# Patient Record
Sex: Male | Born: 2000 | Race: Black or African American | Hispanic: No | Marital: Single | Smoking: Never smoker
Health system: Southern US, Community
[De-identification: ages and names within clinical notes are randomized; demographics above are authoritative.]

## PROBLEM LIST (undated history)

## (undated) HISTORY — PX: APPENDECTOMY: SHX54

---
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
## 2021-01-17 ENCOUNTER — Telehealth (INDEPENDENT_AMBULATORY_CARE_PROVIDER_SITE_OTHER): Payer: Medicaid Other | Admitting: Physician Assistant

## 2021-01-17 ENCOUNTER — Other Ambulatory Visit: Payer: Self-pay

## 2021-01-17 ENCOUNTER — Encounter (HOSPITAL_COMMUNITY): Payer: Self-pay | Admitting: Physician Assistant

## 2021-01-17 DIAGNOSIS — F6381 Intermittent explosive disorder: Secondary | ICD-10-CM

## 2021-01-17 MED ORDER — CARBAMAZEPINE 100 MG PO CHEW: 100.0000 mg | CHEWABLE_TABLET | Freq: Two times a day (BID) | ORAL | 2 refills | Status: DC

## 2021-01-17 NOTE — Progress Notes (Signed)
BH MD/PA/NP OP Progress Note  Virtual Visit via Telephone Note  I connected with Austin Cooley on 01/17/21 at  3:00 PM EST by telephone and verified that I am speaking with the correct person using two identifiers.  Location: Patient: Home Provider: Clinic   I discussed the limitations, risks, security and privacy concerns of performing an evaluation and management service by telephone and the availability of in person appointments. I also discussed with the patient that there may be a patient responsible charge related to this service. The patient expressed understanding and agreed to proceed.  Follow Up Instructions:  I discussed the assessment and treatment plan with the patient. The patient was provided an opportunity to ask questions and all were answered. The patient agreed with the plan and demonstrated an understanding of the instructions.   The patient was advised to call back or seek an in-person evaluation if the symptoms worsen or if the condition fails to improve as anticipated.  I provided 15 minutes of non-face-to-face time during this encounter.  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  Patient reports no issues or concerns with his current regimen of medication.  Patient denies the need for dosage adjustments at this time and is requesting a refill for his medication upon conclusion of the encounter.  Patient denies any new life-changing events or new stressors.  Patient is pleasant,  calm, cooperative, and fully engaged in conversation during the encounter.  Patient reports that he is doing well and spends a lot of his time working.  Patient denies suicidal and homicidal ideations.  He further denies auditory or visual hallucinations.  Patient endorses good sleep and receives on average 6 to 8 hours per sleep each night.  Patient endorses good appetite and states that he eats on average 1-2 meals per day.  Patient denies alcohol consumption, tobacco use, and illicit drug use.   Visit Diagnosis:    ICD-10-CM   1. Intermittent explosive disorder in adult  F63.81 carbamazepine (TEGRETOL) 100 MG chewable tablet    Past Psychiatric History:  Intermittent explosive disorder in adult  Past Medical History: History reviewed. No pertinent past medical history.  Past Surgical History:  Procedure Laterality Date  . APPENDECTOMY      Family Psychiatric History: Unknown  Family History: History reviewed. No pertinent family history.  Social History:  Social History   Socioeconomic History  . Marital status: Single    Spouse name: Not on file  . Number of children: Not on file  . Years of education: Not on file  . Highest education level: Not on file  Occupational History  . Not on file  Tobacco Use  . Smoking status: Never Smoker  . Smokeless tobacco: Never Used  Substance and Sexual Activity  . Alcohol use: Never  . Drug use: Never  . Sexual activity: Not on file  Other Topics Concern  . Not on file  Social History Narrative  . Not on file   Social Determinants of Health   Financial Resource Strain: Not on file  Food Insecurity: Not on file  Transportation Needs: Not on file  Physical Activity: Not on file  Stress: Not on file  Social Connections: Not on file    Allergies: No Known Allergies  Metabolic Disorder Labs: Lab Results  Component Value Date   HGBA1C 5.8 (H) 07/25/2020   MPG 119.76 07/25/2020   No results found for: PROLACTIN Lab  Results  Component Value Date   CHOL 138 07/25/2020   TRIG 25 07/25/2020   HDL 51 07/25/2020   CHOLHDL 2.7 07/25/2020   VLDL 5 07/25/2020   LDLCALC 82 07/25/2020   Lab Results  Component Value Date   TSH 0.699 07/25/2020    Therapeutic Level Labs: No results found for: LITHIUM No results found for: VALPROATE No components found for:  CBMZ  Current Medications: Current Outpatient Medications  Medication Sig Dispense Refill  . carbamazepine (TEGRETOL) 100 MG chewable tablet Chew 1 tablet (100 mg total) by mouth 2 (two) times daily. 60 tablet 2   No current facility-administered medications for this visit.     Musculoskeletal: Strength & Muscle Tone: Unable to assess due to telemedicine visit Gait & Station: Unable to assess due to telemedicine visit Patient leans: Unable to assess due to telemedicine visit  Psychiatric Specialty Exam: Review of Systems  Psychiatric/Behavioral: Negative for decreased concentration, dysphoric mood, hallucinations, self-injury, sleep disturbance and suicidal ideas. The patient is not nervous/anxious and is not hyperactive.     There were no vitals taken for this visit.There is no height or weight on file to calculate BMI.  General Appearance: Unable to assess due to telemedicine visit  Eye Contact:  Unable to assess due to telemedicine visit  Speech:  Clear and Coherent and Normal Rate  Volume:  Normal  Mood:  Euthymic  Affect:  Appropriate  Thought Process:  Coherent and Descriptions of Associations: Intact  Orientation:  Full (Time, Place, and Person)  Thought Content: WDL and Logical   Suicidal Thoughts:  No  Homicidal Thoughts:  No  Memory:  Immediate;   Good Recent;   Good Remote;   Good  Judgement:  Good  Insight:  Good  Psychomotor Activity:  Normal  Concentration:  Concentration: Good and Attention Span: Good  Recall:  Good  Fund of Knowledge: Good  Language: Good  Akathisia:  NA  Handed:  Right  AIMS (if indicated):  not done  Assets:  Communication Skills Desire for Improvement Housing Physical Health Social Support Vocational/Educational  ADL's:  Intact  Cognition: WNL  Sleep:  Good   Screenings: AIMS   Flowsheet Row [facility]  AIMS Total Score 0    AUDIT   Flowsheet Row Ad[facility] ([facility]  Alcohol Use Disorder Identification Test Final Score (AUDIT) 0    GAD-7   Flowsheet Row Video Visit from 01/17/2021 in [facility]  Total GAD-7 Score 4    PHQ2-9   Flowsheet Row Video Visit from 01/17/2021 in [place]  PHQ-2 Total Score 0    Flowsheet Row Video Visit from 01/17/2021 in [facility] Ad[facility] (Discharged[facility] ED from [facility]24/2020 in [facility] EMERGENCY [facility]PART[facility]  C-SSRS RISK CATEGORY No Risk Error: Q7 should not be populated when Q6 is No High Risk       Assessment and Plan:   Austin Cooley is an  20 year old male with a past psychiatric history significant for 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  Patient denies issues or concerns with his current medication regimen.  Patient denies the need for dosage adjustments at this time and is requesting a refill on his medication.  Patient's medication to be e-prescribed to pharmacy of choice.  1. Intermittent explosive disorder in adult  - carbamazepine (TEGRETOL) 100 MG chewable tablet; Chew 1 tablet (100 mg total) by mouth 2 (two) times daily.  Dispense: 60 tablet; Refill:  2  Patient to follow up in 2 months  Meta Hatchet, PA 01/17/2021, 2:50 PM

## 2021-03-14 ENCOUNTER — Telehealth (INDEPENDENT_AMBULATORY_CARE_PROVIDER_SITE_OTHER): Payer: Medicaid Other | Admitting: Physician Assistant

## 2021-03-14 ENCOUNTER — Other Ambulatory Visit: Payer: Self-pay

## 2021-03-14 ENCOUNTER — Encounter (HOSPITAL_COMMUNITY): Payer: Self-pay | Admitting: Physician Assistant

## 2021-03-14 DIAGNOSIS — F6381 Intermittent explosive disorder: Secondary | ICD-10-CM | POA: Diagnosis not present

## 2021-03-14 NOTE — Progress Notes (Signed)
BH MD/PA/NP OP Progress Note  Virtual Visit via Telephone Note  I connected with Austin Cooley on 03/14/21 at  2:00 PM EDT by telephone and verified that I am speaking with the correct person using two identifiers.  Location: Patient: Home Provider: Clinic   I discussed the limitations, risks, security and privacy concerns of performing an evaluation and management service by telephone and the availability of in person appointments. I also discussed with the patient that there may be a patient responsible charge related to this service. The patient expressed understanding and agreed to proceed.  Follow Up Instructions:   I discussed the assessment and treatment plan with the patient. The patient was provided an opportunity to ask questions and all were answered. The patient agreed with the plan and demonstrated an understanding of the instructions.   The patient was advised to call back or seek an in-person evaluation if the symptoms worsen or if the condition fails to improve as anticipated.  I provided 15 minutes of non-face-to-face time during this encounter.  Meta Hatchet, PA   03/14/2021 2:18 PM Austin Cooley  MRN:  [id number]  Chief Complaint: Follow up and medication management  HPI:   Austin Cooley is an 20 year old male with a past psychiatric history significant for intermittent explosive disorder who presents to [facility] via a virtual telephone visit for follow-up and medication management.  Patient is currently being managed on the following medication: Carbamazepine 100 mg 2 times daily.  Patient reports no issues or concerns regarding his current medication regimen.  Patient denies the need for dosage adjustments at this time and is not requesting refills on his medication at this time.  Patient denies any new stressors or life changing events and is still working.  Patient is pleasant, calm, cooperative, and fully engaged  in conversation during the encounter.  Patient reports that his mood has been good as of late.  Patient denies suicidal or homicidal ideations.  He further denies auditory or visual hallucinations and does not appear to be responding to internal/external stimuli.  Patient endorses good sleep and he received an average of 8 hours of sleep each night.  Patient endorses good appetite and eats on average 2 full sized meals per day.  Patient denies alcohol consumption, tobacco use, and illicit drug use.   Visit Diagnosis:    ICD-10-CM   1. Intermittent explosive disorder in adult  F63.81     Past Psychiatric History:  Intermittent explosive disorderin adult  Past Medical History: History reviewed. No pertinent past medical history.  Past Surgical History:  Procedure Laterality Date  . APPENDECTOMY      Family Psychiatric History:  Unknown  Family History: History reviewed. No pertinent family history.  Social History:  Social History   Socioeconomic History  . Marital status: Single    Spouse name: Not on file  . Number of children: Not on file  . Years of education: Not on file  . Highest education level: Not on file  Occupational History  . Not on file  Tobacco Use  . Smoking status: Never Smoker  . Smokeless tobacco: Never Used  Substance and Sexual Activity  . Alcohol use: Never  . Drug use: Never  . Sexual activity: Not on file  Other Topics Concern  . Not on file  Social History Narrative  . Not on file   Social Determinants of Health   Financial Resource Strain: Not on file  Food Insecurity: Not on  file  Transportation Needs: Not on file  Physical Activity: Not on file  Stress: Not on file  Social Connections: Not on file    Allergies: No Known Allergies  Metabolic Disorder Labs: Lab Results  Component Value Date   HGBA1C 5.8 (H) 07/25/2020   MPG 119.76 07/25/2020   No results found for: PROLACTIN Lab Results  Component Value Date   CHOL 138  07/25/2020   TRIG 25 07/25/2020   HDL 51 07/25/2020   CHOLHDL 2.7 07/25/2020   VLDL 5 07/25/2020   LDLCALC 82 07/25/2020   Lab Results  Component Value Date   TSH 0.699 07/25/2020    Therapeutic Level Labs: No results found for: LITHIUM No results found for: VALPROATE No components found for:  CBMZ  Current Medications: Current Outpatient Medications  Medication Sig Dispense Refill  . carbamazepine (TEGRETOL) 100 MG chewable tablet Chew 1 tablet (100 mg total) by mouth 2 (two) times daily. 60 tablet 2   No current facility-administered medications for this visit.     Musculoskeletal: Strength & Muscle Tone: Unable to assess due to telemedicine visit Gait & Station: Unable to assess due to telemedicine visit Patient leans: Unable to assess due to telemedicine visit  Psychiatric Specialty Exam: Review of Systems  Psychiatric/Behavioral: Negative for decreased concentration, dysphoric mood, hallucinations, self-injury, sleep disturbance and suicidal ideas. The patient is not nervous/anxious and is not hyperactive.     There were no vitals taken for this visit.There is no height or weight on file to calculate BMI.  General Appearance: Unable to assess due to telemedicine visit  Eye Contact:  Unable to assess due to telemedicine visit  Speech:  Clear and Coherent and Normal Rate  Volume:  Normal  Mood:  Euthymic  Affect:  Appropriate  Thought Process:  Coherent and Descriptions of Associations: Intact  Orientation:  Full (Time, Place, and Person)  Thought Content: WDL and Logical   Suicidal Thoughts:  No  Homicidal Thoughts:  No  Memory:  Immediate;   Good Recent;   Good Remote;   Good  Judgement:  Good  Insight:  Good  Psychomotor Activity:  Normal  Concentration:  Concentration: Good and Attention Span: Good  Recall:  Good  Fund of Knowledge: Good  Language: Good  Akathisia:  NA  Handed:  Right  AIMS (if indicated): not done  Assets:  Communication  Skills Desire for Improvement Housing Social Support Vocational/Educational  ADL's:  Intact  Cognition: WNL  Sleep:  Good   Screenings: AIMS   Flowsheet Row [facility]  AIMS Total Score 0    AUDIT   Flowsheet Row Ad[facility] ([facility]  Alcohol Use Disorder Identification Test Final Score (AUDIT) 0    GAD-7   Flowsheet Row Video Visit from 03/14/2021 in [facility] Video Visit from 01/17/2021 in [facility]  Total GAD-7 Score 0 4    PHQ2-9   Flowsheet Row Video Visit from 03/14/2021 in [facility] Video Visit from 01/17/2021 in [place]  PHQ-2 Total Score 0 0    Flowsheet Row Video Visit from 03/14/2021 in [facility] Video Visit from 01/17/2021 in [facility] Admission (Discharged[facility]  C-SSRS RISK CATEGORY No Risk No Risk Error: Q7 should not be populated when Q6 is  No       Assessment and Plan:   Austin Cooley is an 20 year old male with a past psychiatric history significant for intermittent explosive disorder who presents to [facility] via a virtual telephone visit for follow-up and medication management. Patient reports no issues or concerns regarding his current medication regimen.  Patient denies the need for dosage adjustments at this time and is not requesting refills on his medication at this time.  1. Intermittent explosive disorder in adult Patient to continue taking carbamazepine 100 mg 2 times daily  Patient to follow up in 3 months  Meta Hatchet, PA 03/14/2021, 2:18 PM

## 2021-06-13 ENCOUNTER — Telehealth (INDEPENDENT_AMBULATORY_CARE_PROVIDER_SITE_OTHER): Payer: Medicaid Other | Admitting: Physician Assistant

## 2021-06-13 ENCOUNTER — Other Ambulatory Visit: Payer: Self-pay

## 2021-06-13 ENCOUNTER — Encounter (HOSPITAL_COMMUNITY): Payer: Self-pay | Admitting: Physician Assistant

## 2021-06-13 DIAGNOSIS — F6381 Intermittent explosive disorder: Secondary | ICD-10-CM | POA: Diagnosis not present

## 2021-06-13 MED ORDER — CARBAMAZEPINE 100 MG PO CHEW: 100.0000 mg | CHEWABLE_TABLET | Freq: Two times a day (BID) | ORAL | 2 refills | Status: DC

## 2021-06-13 NOTE — Progress Notes (Signed)
BH MD/PA/NP OP Progress Note  Virtual Visit via Telephone Note  I connected with Austin Cooley on 06/13/21 at  1:30 PM EDT by telephone and verified that I am speaking with the correct person using two identifiers.  Location: Patient: Home Provider: Clinic   I discussed the limitations, risks, security and privacy concerns of performing an evaluation and management service by telephone and the availability of in person appointments. I also discussed with the patient that there may be a patient responsible charge related to this service. The patient expressed understanding and agreed to proceed.  Follow Up Instructions:  I discussed the assessment and treatment plan with the patient. The patient was provided an opportunity to ask questions and all were answered. The patient agreed with the plan and demonstrated an understanding of the instructions.   The patient was advised to call back or seek an in-person evaluation if the symptoms worsen or if the condition fails to improve as anticipated.  I provided 15 minutes of non-face-to-face time during this encounter.  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    Patient is currently being managed on the following medication: Carbamazepine 100 mg 2 times daily.  Patient reports no issues or concerns regarding his current medication regimen.  Patient denies a need for dosage adjustments at this time.  Patient denies depressive symptoms nor has he experienced episodes of anxiousness. While performing a GAD-7 screen, patient scored a 12.  He noted that he will occasionally feel restless and  slightly agitated.  Patient is unable to identify any discernible triggers to his restlessness.  Patient denies any new stressors or major life changing events.  Patient denies any other issues regarding his mental health.  Patient is alert and oriented x4, calm, cooperative, and fully engaged in conversation during the encounter.  Patient reports good mood.  Patient denies suicidal or homicidal ideations.  He further denies auditory or visual hallucinations and does not appear to be responding to internal/external stimuli.  Patient endorses good sleep and receives on average 6 hours of sleep each night.  Patient endorses good appetite and eats on average 3 meals per day.  Patient denies alcohol consumption, tobacco use, and illicit drug use.  Visit Diagnosis:    ICD-10-CM   1. Intermittent explosive disorder in adult  F63.81 carbamazepine (TEGRETOL) 100 MG chewable tablet      Past Psychiatric History:  Intermittent explosive disorder in adult  Past Medical History: History reviewed. No pertinent past medical history.  Past Surgical History:  Procedure Laterality Date   APPENDECTOMY      Family Psychiatric History:  Unknown  Family History: History reviewed. No pertinent family history.  Social History:  Social History   Socioeconomic History   Marital status: Single    Spouse name: Not on file   Number of children: Not on file   Years of education: Not on file   Highest education level: Not on file  Occupational History   Not on file  Tobacco Use   Smoking status: Never   Smokeless tobacco: Never  Substance and Sexual Activity   Alcohol use: Never   Drug use: Never   Sexual activity:  Not on file  Other Topics Concern   Not on file  Social History Narrative   Not on file   Social Determinants of Health   Financial Resource Strain: Not on file  Food Insecurity: Not on file  Transportation Needs: Not on file  Physical Activity: Not on file  Stress: Not on file   Social Connections: Not on file    Allergies: No Known Allergies  Metabolic Disorder Labs: Lab Results  Component Value Date   HGBA1C 5.8 (H) 07/25/2020   MPG 119.76 07/25/2020   No results found for: PROLACTIN Lab Results  Component Value Date   CHOL 138 07/25/2020   TRIG 25 07/25/2020   HDL 51 07/25/2020   CHOLHDL 2.7 07/25/2020   VLDL 5 07/25/2020   LDLCALC 82 07/25/2020   Lab Results  Component Value Date   TSH 0.699 07/25/2020    Therapeutic Level Labs: No results found for: LITHIUM No results found for: VALPROATE No components found for:  CBMZ  Current Medications: Current Outpatient Medications  Medication Sig Dispense Refill   carbamazepine (TEGRETOL) 100 MG chewable tablet Chew 1 tablet (100 mg total) by mouth 2 (two) times daily. 60 tablet 2   No current facility-administered medications for this visit.     Musculoskeletal: Strength & Muscle Tone: Unable to assess due to telemedicine visit Gait & Station: Unable to assess due to telemedicine visit Patient leans: Unable to assess due to telemedicine visit  Psychiatric Specialty Exam: Review of Systems  Psychiatric/Behavioral:  Negative for decreased concentration, dysphoric mood, hallucinations, self-injury, sleep disturbance and suicidal ideas. The patient is nervous/anxious. The patient is not hyperactive.    There were no vitals taken for this visit.There is no height or weight on file to calculate BMI.  General Appearance: Unable to assess due to telemedicine visit  Eye Contact:  Unable to assess due to telemedicine visit  Speech:  Clear and Coherent and Normal Rate  Volume:  Normal  Mood:  Euthymic  Affect:  Appropriate  Thought Process:  Coherent and Descriptions of Associations: Intact  Orientation:  Full (Time, Place, and Person)  Thought Content: WDL   Suicidal Thoughts:  No  Homicidal Thoughts:  No  Memory:  Immediate;   Good Recent;   Good Remote;   Good  Judgement:  Good  Insight:   Good  Psychomotor Activity:  Normal  Concentration:  Concentration: Good and Attention Span: Good  Recall:  Good  Fund of Knowledge: Good  Language: Good  Akathisia:  NA  Handed:  Right  AIMS (if indicated): not done  Assets:  Communication Skills Desire for Improvement Housing Social Support Vocational/Educational  ADL's:  Intact  Cognition: WNL  Sleep:  Good   Screenings: AIMS    Flowsheet Row [facility]  AIMS Total Score 0      AUDIT    Flowsheet Row Ad[facility] (Discharged[facility]  Alcohol Use Disorder Identification Test Final Score (AUDIT) 0      GAD-7    Flowsheet Row Video Visit from 06/13/2021 in [facility] Video Visit from 03/14/2021 in [facility] Video Visit from 01/17/2021 in [facility]  Total GAD-7 Score 12 0 4      PHQ2-9    Flowsheet Row Video Visit from 06/13/2021 in [facility] Video Visit from 03/14/2021 in [facility]  Video Visit from 01/17/2021 in [facility]  PHQ-2 Total Score 0 0 0      Flowsheet Row Video Visit from 06/13/2021 in [facility] Video Visit from 03/14/2021 in [facility] Video Visit from 01/17/2021 in [facility]  C-SSRS RISK CATEGORY No Risk No Risk No Risk        Assessment and Plan:   Stephone Gum is a 20 year old male with a past psychiatric history significant for 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  Patient denies any issues with his current medication regimen.  Patient is requesting refills following  the conclusion of the encounter.  Patient notes that he does experience instances of agitation and restlessness but states that his medications have been helpful in negating the symptoms.  Patient's medication to be e-prescribed to pharmacy of choice.  1. Intermittent explosive disorder in adult  - carbamazepine (TEGRETOL) 100 MG chewable tablet; Chew 1 tablet (100 mg total) by mouth 2 (two) times daily.  Dispense: 60 tablet; Refill: 2  Patient to follow up in 3 months A total of 15 minutes was utilized with the patient/reviewing patient's chart  Meta Hatchet, PA 06/13/2021, 7:34 PM

## 2021-09-12 ENCOUNTER — Telehealth (INDEPENDENT_AMBULATORY_CARE_PROVIDER_SITE_OTHER): Payer: Medicaid Other | Admitting: Physician Assistant

## 2021-09-12 ENCOUNTER — Other Ambulatory Visit: Payer: Self-pay

## 2021-09-12 ENCOUNTER — Encounter (HOSPITAL_COMMUNITY): Payer: Self-pay | Admitting: Physician Assistant

## 2021-09-12 DIAGNOSIS — F6381 Intermittent explosive disorder: Secondary | ICD-10-CM

## 2021-09-12 MED ORDER — CARBAMAZEPINE 100 MG PO CHEW: 100.0000 mg | CHEWABLE_TABLET | Freq: Two times a day (BID) | ORAL | 2 refills | Status: DC

## 2021-09-12 NOTE — Progress Notes (Signed)
BH MD/PA/NP OP Progress Note  Virtual Visit via Telephone Note  I connected with [name] on 09/12/21 at  1:00 PM EDT by telephone and verified that I am speaking with the correct person using two identifiers.  Location: Patient: Home Provider: Clinic   I discussed the limitations, risks, security and privacy concerns of performing an evaluation and management service by telephone and the availability of in person appointments. I also discussed with the patient that there may be a patient responsible charge related to this service. The patient expressed understanding and agreed to proceed.  Follow Up Instructions:  I discussed the assessment and treatment plan with the patient. The patient was provided an opportunity to ask questions and all were answered. The patient agreed with the plan and demonstrated an understanding of the instructions.   The patient was advised to call back or seek an in-person evaluation if the symptoms worsen or if the condition fails to improve as anticipated.  I provided 19 minutes of non-face-to-face time during this encounter.  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 Patient is currently being managed on the following medication: Carbamazepine 100 mg 2 times daily.   Patient reports no issues or concerns regarding his current medication regimen.  Patient denies a need for dosage adjustments at this time and is requesting refills on his medications following the conclusion of the encounter.  Patient denies experiencing any depressive episodes.  He also denies anxiety stating that he is currently  stress free.  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  The only factor that the patient stresses over is whether or not he is doing his job correctly.  A GAD-7 screen was performed with the patient scoring a 3.  Patient is alert and oriented x4, pleasant, calm, cooperative, and fully engaged in conversation during the encounter. Patient endorses good mood.  Patient denies suicidal or homicidal ideations.  He further denies auditory or visual hallucinations and does not appear to be responding to internal/external stimuli.  Patient endorses good sleep and receives on average 8 hours of sleep each night.  Patient endorses good appetite and eats on average 2 meals per day.  Patient expresses that his hunger and can often change depending on the time of day.  Patient denies alcohol consumption, tobacco use, and illicit drug use.  Visit Diagnosis:    ICD-10-CM   1. Intermittent explosive disorder in adult  F63.81 carbamazepine (TEGRETOL) 100 MG chewable tablet      Past Psychiatric History:  Intermittent explosive disorder in adult  Past Medical History: No past medical history on file.  Past Surgical History:  Procedure Laterality Date   APPENDECTOMY      Family Psychiatric History:  Unknown  Family History: No family history on file.  Social History:  Social History   Socioeconomic History   Marital status: Single    Spouse name: Not on file   Number of children: Not on file   Years of education: Not on file   Highest education level: Not on file  Occupational History   Not on file  Tobacco Use   Smoking status: Never   Smokeless tobacco: Never  Substance and Sexual Activity   Alcohol use: Never   Drug use: Never   Sexual activity: Not on file  Other Topics Concern   Not on file  Social History Narrative   Not on file   Social Determinants of Health   Financial Resource Strain: Not on  file  Food Insecurity: Not on file  Transportation Needs: Not on file  Physical Activity: Not on file  Stress: Not on file  Social Connections: Not on file    Allergies: No Known Allergies  Metabolic Disorder Labs: Lab Results  Component Value Date   HGBA1C 5.8 (H) 07/25/2020   MPG 119.76 07/25/2020   No results found for: PROLACTIN Lab Results  Component Value Date   CHOL 138 07/25/2020   TRIG 25 07/25/2020   HDL 51 07/25/2020   CHOLHDL 2.7 07/25/2020   VLDL 5 07/25/2020   LDLCALC 82 07/25/2020   Lab Results  Component Value Date   TSH 0.699 07/25/2020    Therapeutic Level Labs: No results found for: LITHIUM No results found for: VALPROATE No components found for:  CBMZ  Current Medications: Current Outpatient Medications  Medication Sig Dispense Refill   carbamazepine (TEGRETOL) 100 MG chewable tablet Chew 1 tablet (100 mg total) by mouth 2 (two) times daily. 60 tablet 2   No current facility-administered medications for this visit.     Musculoskeletal: Strength & Muscle Tone: Unable to assess due to telemedicine visit Gait & Station: Unable to assess due to telemedicine visit Patient leans: Unable to assess due to telemedicine visit  Psychiatric Specialty Exam: Review of Systems  Psychiatric/Behavioral:  Negative for decreased concentration, dysphoric mood, hallucinations, self-injury, sleep disturbance and suicidal ideas. The patient is not nervous/anxious and is not hyperactive.    There were no vitals taken for this visit.There is no height or weight on file to calculate BMI.  General Appearance: Unable to assess due to telemedicine visit  Eye Contact:  Unable to assess due to telemedicine visit  Speech:  Clear and Coherent and Normal Rate  Volume:  Normal  Mood:  Euthymic  Affect:  Appropriate  Thought Process:  Coherent and Descriptions of Associations: Intact  Orientation:  Full (Time, Place, and Person)  Thought Content: WDL   Suicidal  Thoughts:  No  Homicidal Thoughts:  No  Memory:  Immediate;   Good Recent;   Good Remote;   Good  Judgement:  Good  Insight:  Good  Psychomotor Activity:  Normal  Concentration:  Concentration: Good and Attention Span: Good  Recall:  Good  Fund of Knowledge: Good  Language: Good  Akathisia:  NA  Handed:  Right  AIMS (if indicated): not done  Assets:  Communication Skills Desire for Improvement Housing Social Support Vocational/Educational  ADL's:  Intact  Cognition: WNL  Sleep:  Good   Screenings: AIMS    Flowsheet Row [facility]  AIMS Total Score 0      AUDIT    Flowsheet Row Ad[facility]  Alcohol Use Disorder Identification Test Final Score (AUDIT) 0      GAD-7    Flowsheet Row Video Visit from 09/12/2021 in [facility] Video Visit from 06/13/2021 in [facility] Video Visit from 03/14/2021 in [facility] Video Visit from 01/17/2021 in [facility]  Total GAD-7 Score 3 12 0 4      PHQ2-9    Flowsheet Row Video Visit from 09/12/2021 in [facility] Video Visit from 06/13/2021 in [facility] Video Visit from 03/14/2021 in [facility] Video Visit from 01/17/2021 in [facility]  PHQ-2 Total Score 1 0 0 0      Flowsheet Row Video Visit from 09/12/2021 in [facility] Video Visit from 06/13/2021 in [facility] Video Visit from 03/14/2021 in [facility]  C-SSRS RISK CATEGORY No Risk No Risk No Risk        Assessment and Plan:   Austin Cooley is a 20 year old male with a past psychiatric history significant for  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  Patient reports no issues or concerns regarding his current medication regimen.  Patient denies the need for dosage adjustments at this time and is requesting refills on his medication following the conclusion of the encounter.  Patient's medication to be e-prescribed to pharmacy of choice.  1. Intermittent explosive disorder in adult  - carbamazepine (TEGRETOL) 100 MG chewable tablet; Chew 1 tablet (100 mg total) by mouth 2 (two) times daily.  Dispense: 60 tablet; Refill: 2  Patient to follow up in 3 months Provider spent a total of 19 minutes with the patient/reviewing the patient's chart  Meta Hatchet, PA 09/12/2021, 10:58 PM

## 2021-12-12 ENCOUNTER — Encounter (HOSPITAL_COMMUNITY): Payer: Self-pay | Admitting: Physician Assistant

## 2021-12-12 ENCOUNTER — Telehealth (INDEPENDENT_AMBULATORY_CARE_PROVIDER_SITE_OTHER): Payer: Medicaid Other | Admitting: Physician Assistant

## 2021-12-12 DIAGNOSIS — F6381 Intermittent explosive disorder: Secondary | ICD-10-CM | POA: Diagnosis not present

## 2021-12-12 MED ORDER — CARBAMAZEPINE 100 MG PO CHEW: 100.0000 mg | CHEWABLE_TABLET | Freq: Two times a day (BID) | ORAL | 2 refills | Status: DC

## 2021-12-12 NOTE — Progress Notes (Signed)
BH MD/PA/NP OP Progress Note  Virtual Visit via Telephone Note  I connected with Austin Cooley on 12/12/21 at  1:00 PM EST by telephone and verified that I am speaking with the correct person using two identifiers.  Location: Patient: Home Provider: Clinic   I discussed the limitations, risks, security and privacy concerns of performing an evaluation and management service by telephone and the availability of in person appointments. I also discussed with the patient that there may be a patient responsible charge related to this service. The patient expressed understanding and agreed to proceed.  Follow Up Instructions:  I discussed the assessment and treatment plan with the patient. The patient was provided an opportunity to ask questions and all were answered. The patient agreed with the plan and demonstrated an understanding of the instructions.   The patient was advised to call back or seek an in-person evaluation if the symptoms worsen or if the condition fails to improve as anticipated.  I provided 8 minutes of non-face-to-face time during this encounter.  Austin HatchetUchenna E Kamarius Buckbee, PA    12/12/2021 8:08 PM Austin Cooley  MRN:  [id number]  Chief Complaint: Follow up and medication management  HPI:   Austin Cooley is a 21 year old male with a past psychiatric history significant for intermittent explosive disorder and adult [facility] telephone visit for follow-up and medication management.  Patient is currently being managed on the following medication: Carbamazepine 100 mg 2 times daily.  Patient reports no issues or concerns regarding his current medication regimen.  Patient denies the need for dosage adjustments at this time and is requesting refills on his medication following the conclusion of the encounter.  Patient denies experiencing any depressive episodes.  Patient further denies anxiety and reports no new  stressors at this time.  Patient is alert and oriented x4, calm, cooperative, and fully engaged in conversation during the encounter.  Patient endorses good mood.  Patient denies suicidal or homicidal ideations.  She further denies auditory or visual hallucinations and does not appear to be responding to internal/external stimuli.  Patient endorses good sleep and receives on average 8 hours of sleep each night.  Patient endorses good appetite and eats on average 3 meals per day.  Patient denies alcohol consumption, tobacco use, and illicit drug use.  Visit Diagnosis:    ICD-10-CM   1. Intermittent explosive disorder in adult  F63.81 carbamazepine (TEGRETOL) 100 MG chewable tablet      Past Psychiatric History:  Intermittent explosive disorder in adult  Past Medical History: History reviewed. No pertinent past medical history.  Past Surgical History:  Procedure Laterality Date   APPENDECTOMY      Family Psychiatric History:  Unknown  Family History: History reviewed. No pertinent family history.  Social History:  Social History   Socioeconomic History   Marital status: Single    Spouse name: Not on file   Number of children: Not on file   Years of education: Not on file   Highest education level: Not on file  Occupational History   Not on file  Tobacco Use   Smoking status: Never   Smokeless tobacco: Never  Substance and Sexual Activity   Alcohol use: Never   Drug use: Never   Sexual activity: Not on file  Other Topics Concern   Not on file  Social History Narrative   Not on file   Social Determinants of Health   Financial Resource Strain: Not on  file  Food Insecurity: Not on file  Transportation Needs: Not on file  Physical Activity: Not on file  Stress: Not on file  Social Connections: Not on file    Allergies: No Known Allergies  Metabolic Disorder Labs: Lab Results  Component Value Date   HGBA1C 5.8 (H) 07/25/2020   MPG 119.76 07/25/2020   No  results found for: PROLACTIN Lab Results  Component Value Date   CHOL 138 07/25/2020   TRIG 25 07/25/2020   HDL 51 07/25/2020   CHOLHDL 2.7 07/25/2020   VLDL 5 07/25/2020   LDLCALC 82 07/25/2020   Lab Results  Component Value Date   TSH 0.699 07/25/2020    Therapeutic Level Labs: No results found for: LITHIUM No results found for: VALPROATE No components found for:  CBMZ  Current Medications: Current Outpatient Medications  Medication Sig Dispense Refill   carbamazepine (TEGRETOL) 100 MG chewable tablet Chew 1 tablet (100 mg total) by mouth 2 (two) times daily. 60 tablet 2   No current facility-administered medications for this visit.     Musculoskeletal: Strength & Muscle Tone: Unable to assess due to telemedicine visit Gait & Station: Unable to assess due to telemedicine visit Patient leans: Unable to assess due to telemedicine visit  Psychiatric Specialty Exam: Review of Systems  Psychiatric/Behavioral:  Negative for decreased concentration, dysphoric mood, hallucinations, self-injury, sleep disturbance and suicidal ideas. The patient is not nervous/anxious and is not hyperactive.    There were no vitals taken for this visit.There is no height or weight on file to calculate BMI.  General Appearance: Unable to assess due to telemedicine visit  Eye Contact:  Unable to assess due to telemedicine visit  Speech:  Clear and Coherent and Normal Rate  Volume:  Normal  Mood:  Euthymic  Affect:  Appropriate  Thought Process:  Coherent and Descriptions of Associations: Intact  Orientation:  Full (Time, Place, and Person)  Thought Content: WDL   Suicidal Thoughts:  No  Homicidal Thoughts:  No  Memory:  Immediate;   Good Recent;   Good Remote;   Good  Judgement:  Good  Insight:  Good  Psychomotor Activity:  Normal  Concentration:  Concentration: Good and Attention Span: Good  Recall:  Good  Fund of Knowledge: Good  Language: Good  Akathisia:  NA  Handed:  Right   AIMS (if indicated): not done  Assets:  Communication Skills Desire for Improvement Housing Social Support Vocational/Educational  ADL's:  Intact  Cognition: WNL  Sleep:  Good   Screenings: AIMS    Flowsheet Row [facility]  AIMS Total Score 0      AUDIT    Flowsheet Row Ad[facility] ([facility]  Alcohol Use Disorder Identification Test Final Score (AUDIT) 0      GAD-7    Flowsheet Row Video Visit from 12/12/2021 in [facility] Video Visit from 09/12/2021 in [facility] Video Visit from 06/13/2021 in [facility] Video Visit from 03/14/2021 in [facility] Video Visit from 01/17/2021 in [facility]  Total GAD-7 Score 0 3 12 0 4      PHQ2-9    Flowsheet Row Video Visit from 12/12/2021 in [facility] Video Visit from 09/12/2021 in [facility] Video Visit from 06/13/2021 in [facility] Video Visit  from 03/14/2021 in [facility] Video Visit from 01/17/2021 in [facility]  PHQ-2 Total Score 0 1 0 0 0      Flowsheet Row Video Visit from 12/12/2021 in [facility] Video Visit from 09/12/2021 in [facility] Video Visit from 06/13/2021 in [facility]  C-SSRS RISK CATEGORY No Risk No Risk No Risk        Assessment and Plan:   Austin Cooley is a 21 year old male with a past psychiatric history significant for intermittent explosive disorder and adult who presents to [facility] via virtual telephone visit for follow-up and medication management.   Patient reports no issues or concerns regarding his current medication regimen.  Patient denies the need for dosage adjustments at this time and is requesting refills on his medication following the conclusion of the encounter.  Patient's medication to be e-prescribed to pharmacy of choice.  1. Intermittent explosive disorder in adult  - carbamazepine (TEGRETOL) 100 MG chewable tablet; Chew 1 tablet (100 mg total) by mouth 2 (two) times daily.  Dispense: 60 tablet; Refill: 2  Patient to follow up in 3 months Provider spent a total of 8 minutes with the patient/reviewing patient's chart  Austin Hatchet, PA 12/12/2021, 8:08 PM

## 2022-04-10 ENCOUNTER — Telehealth (INDEPENDENT_AMBULATORY_CARE_PROVIDER_SITE_OTHER): Payer: Medicaid Other | Admitting: Physician Assistant

## 2022-04-10 ENCOUNTER — Encounter (HOSPITAL_COMMUNITY): Payer: Self-pay | Admitting: Physician Assistant

## 2022-04-10 DIAGNOSIS — F6381 Intermittent explosive disorder: Secondary | ICD-10-CM

## 2022-04-10 NOTE — Progress Notes (Addendum)
BH MD/PA/NP OP Progress Note  Virtual Visit via Telephone Note  I connected with Austin Cooley on 04/10/22 at  1:00 PM EDT by telephone and verified that I am speaking with the correct person using two identifiers.  Location: Patient: Home Provider: Clinic   I discussed the limitations, risks, security and privacy concerns of performing an evaluation and management service by telephone and the availability of in person appointments. I also discussed with the patient that there may be a patient responsible charge related to this service. The patient expressed understanding and agreed to proceed.  Follow Up Instructions:  I discussed the assessment and treatment plan with the patient. The patient was provided an opportunity to ask questions and all were answered. The patient agreed with the plan and demonstrated an understanding of the instructions.   The patient was advised to call back or seek an in-person evaluation if the symptoms worsen or if the condition fails to improve as anticipated.  I provided 8 minutes of non-face-to-face time during this encounter.  Meta Hatchet, PA    04/10/2022 3:20 PM Maxamilian Amadon  MRN:  [id number]  Chief Complaint:  Chief Complaint   Follow-up; Medication Refill    HPI:   Austin Cooley is a 21 year old male with a past psychiatric history significant for intermittent explosive disorder and adult who presents to [facility] Outpatient clinic via virtual telephone visit for follow-up and medication management.  Patient is currently being managed on the following medication: Carbamazepine 100 mg 2 times daily.  Patient reports no issues or concerns regarding his medication use.  Patient denies the need for dosage adjustments at this time.  Patient denies experiencing depressive symptoms or anxiety.  Patient denies any new stressors at this time.  He further denies any issues or concerns regarding his mental health.  A GAD-7  screen was performed with the patient scoring a 6.  Patient is alert and oriented x4, pleasant, calm, cooperative, and fully engaged in conversation during the encounter.  Patient endorses feeling peaceful.  Patient denies suicidal or homicidal ideations.  He further denies auditory or visual hallucinations and does not appear to be responding to internal/external stimuli.  Patient endorses good sleep and receives on average 10 hours of sleep each night.  He states that he will occasionally engage in a 4-hour nap in the day.  Patient endorses good appetite and eats on average 2 meals per day.  Patient denies alcohol consumption, tobacco use, and illicit drug use.  Visit Diagnosis:    ICD-10-CM   1. Intermittent explosive disorder in adult  F63.81       Past Psychiatric History:  Intermittent explosive disorder in adult  Past Medical History: History reviewed. No pertinent past medical history.  Past Surgical History:  Procedure Laterality Date   APPENDECTOMY      Family Psychiatric History:  Unknown  Family History: History reviewed. No pertinent family history.  Social History:  Social History   Socioeconomic History   Marital status: Single    Spouse name: Not on file   Number of children: Not on file   Years of education: Not on file   Highest education level: Not on file  Occupational History   Not on file  Tobacco Use   Smoking status: Never   Smokeless tobacco: Never  Substance and Sexual Activity   Alcohol use: Never   Drug use: Never   Sexual activity: Not on file  Other Topics Concern   Not on  file  Social History Narrative   Not on file   Social Determinants of Health   Financial Resource Strain: Not on file  Food Insecurity: Not on file  Transportation Needs: Not on file  Physical Activity: Not on file  Stress: Not on file  Social Connections: Not on file    Allergies: No Known Allergies  Metabolic Disorder Labs: Lab Results  Component Value  Date   HGBA1C 5.8 (H) 07/25/2020   MPG 119.76 07/25/2020   No results found for: PROLACTIN Lab Results  Component Value Date   CHOL 138 07/25/2020   TRIG 25 07/25/2020   HDL 51 07/25/2020   CHOLHDL 2.7 07/25/2020   VLDL 5 07/25/2020   LDLCALC 82 07/25/2020   Lab Results  Component Value Date   TSH 0.699 07/25/2020    Therapeutic Level Labs: No results found for: LITHIUM No results found for: VALPROATE No components found for:  CBMZ  Current Medications: Current Outpatient Medications  Medication Sig Dispense Refill   carbamazepine (TEGRETOL) 100 MG chewable tablet Chew 1 tablet (100 mg total) by mouth 2 (two) times daily. 60 tablet 2   No current facility-administered medications for this visit.     Musculoskeletal: Strength & Muscle Tone: Unable to assess due to telemedicine visit Gait & Station: Unable to assess due to telemedicine visit Patient leans: Unable to assess due to telemedicine visit  Psychiatric Specialty Exam: Review of Systems  Psychiatric/Behavioral:  Negative for decreased concentration, dysphoric mood, hallucinations, self-injury, sleep disturbance and suicidal ideas. The patient is not nervous/anxious and is not hyperactive.    There were no vitals taken for this visit.There is no height or weight on file to calculate BMI.  General Appearance: Unable to assess due to telemedicine visit  Eye Contact:  Unable to assess due to telemedicine visit  Speech:  Clear and Coherent and Normal Rate  Volume:  Normal  Mood:  Euthymic  Affect:  Appropriate  Thought Process:  Coherent and Descriptions of Associations: Intact  Orientation:  Full (Time, Place, and Person)  Thought Content: WDL   Suicidal Thoughts:  No  Homicidal Thoughts:  No  Memory:  Immediate;   Good Recent;   Good Remote;   Good  Judgement:  Good  Insight:  Good  Psychomotor Activity:  Normal  Concentration:  Concentration: Good and Attention Span: Good  Recall:  Good  Fund of  Knowledge: Good  Language: Good  Akathisia:  NA  Handed:  Right  AIMS (if indicated): not done  Assets:  Communication Skills Desire for Improvement Housing Social Support Vocational/Educational  ADL's:  Intact  Cognition: WNL  Sleep:  Good   Screenings: AIMS    Flowsheet Row [facility]  AIMS Total Score 0      AUDIT    Flowsheet Row [facility]  Alcohol Use Disorder Identification Test Final Score (AUDIT) 0      GAD-7    Flowsheet Row [facility]  Total GAD-7 Score 6 0 3 12 0      PHQ2-9    Flowsheet Row [facility] Visit from 12/12/2021 in [facility] Video Visit from 09/12/2021 in [facility] Video Visit from 06/13/2021 in [facility] Video Visit from 03/14/2021 in [facility]  PHQ-2 Total Score 0 0 1 0 0      Flowsheet Row Video Visit from 04/10/2022 in [facility] Video Visit from 12/12/2021 in [facility] Video Visit from 09/12/2021 in [facility]  C-SSRS RISK CATEGORY No Risk No Risk No Risk        Assessment and Plan:   Briar Witherspoon is a 21 year old male with a past psychiatric history significant for intermittent explosive disorder and adult who presents to [facility] Outpatient clinic via  virtual telephone visit for follow-up and medication management.  Patient reports no issues or concerns regarding his current medication regimen.  Patient denies depression or anxiety and appears to be stable at this time.  Patient denies the need for refills at this time.  Collaboration of Care: Collaboration of Care: Medication Management AEB patient's medications being managed by this provider and Psychiatrist AEB patient being managed by this behavioral health provider  1. Intermittent explosive disorder in adult Patient to continue taking carbamazepine 100 mg (chewable tablet) 2 times daily for the management of his intermittent explosive disorder  Patient to follow up in 3 months Provider spent a total of 8 minutes with the patient/reviewing patient's chart  Meta Hatchet, PA 04/10/2022, 3:20 PM

## 2022-07-10 ENCOUNTER — Encounter (HOSPITAL_COMMUNITY): Payer: Self-pay | Admitting: Physician Assistant

## 2022-07-10 ENCOUNTER — Telehealth (INDEPENDENT_AMBULATORY_CARE_PROVIDER_SITE_OTHER): Payer: Medicaid Other | Admitting: Physician Assistant

## 2022-07-10 DIAGNOSIS — F6381 Intermittent explosive disorder: Secondary | ICD-10-CM

## 2022-07-10 MED ORDER — CARBAMAZEPINE 100 MG PO CHEW: 100.0000 mg | CHEWABLE_TABLET | Freq: Two times a day (BID) | ORAL | 2 refills | Status: AC

## 2022-07-10 NOTE — Progress Notes (Signed)
BH MD/PA/NP OP Progress Note  Virtual Visit via Telephone Note  I connected with Austin Cooley on 07/10/22 at  1:00 PM EDT by telephone and verified that I am speaking with the correct person using two identifiers.  Location: Patient: Home Provider: Clinic   I discussed the limitations, risks, security and privacy concerns of performing an evaluation and management service by telephone and the availability of in person appointments. I also discussed with the patient that there may be a patient responsible charge related to this service. The patient expressed understanding and agreed to proceed.  Follow Up Instructions:  I discussed the assessment and treatment plan with the patient. The patient was provided an opportunity to ask questions and all were answered. The patient agreed with the plan and demonstrated an understanding of the instructions.   The patient was advised to call back or seek an in-person evaluation if the symptoms worsen or if the condition fails to improve as anticipated.  I provided 10 minutes of non-face-to-face time during this encounter.  Meta Hatchet, PA    07/10/2022 1:15 PM Austin Cooley  MRN:  [id number]  Chief Complaint:  Chief Complaint   Medication Refill; Follow-up    HPI:   Austin Cooley is a 21 year old male with a past psychiatric history significant for intermittent explosive disorder and adult who presents to [facility] Outpatient clinic via virtual telephone visit for follow-up and medication management.  Patient is currently being managed on the following medication: Carbamazepine 100 mg 2 times daily.  Patient reports no issues or concerns regarding his current medication use.  Patient denies the need for dosage adjustments at this time and is requesting refills on his medications following the conclusion of the encounter.  Patient denies experiencing any adverse side effects from his medication.  Patient denies  experiencing depressive episodes and further denies anxiety.  Patient denies any new stressors at this time and states that he spends the majority of his time working and going to school for technology.  Patient denies any other issues regarding his mental health.  Patient is alert and oriented x4, pleasant, calm, cooperative, and fully engaged in conversation during the encounter.  Patient endorses good mood.  Patient denies suicidal or homicidal ideations.  He further denied auditory or visual hallucinations and does not appear to be responding to internal/external stimuli.  Patient endorses good sleep and receives on average 8 to 9 hours of sleep each night.  Patient endorses good appetite and eats on average 2 big meals per day.  Patient denies alcohol consumption, tobacco use, and illicit drug use.  Visit Diagnosis:    ICD-10-CM   1. Intermittent explosive disorder in adult  F63.81 carbamazepine (TEGRETOL) 100 MG chewable tablet      Past Psychiatric History:  Intermittent explosive disorder in adult  Past Medical History: History reviewed. No pertinent past medical history.  Past Surgical History:  Procedure Laterality Date   APPENDECTOMY      Family Psychiatric History:  Unknown  Family History: History reviewed. No pertinent family history.  Social History:  Social History   Socioeconomic History   Marital status: Single    Spouse name: Not on file   Number of children: Not on file   Years of education: Not on file   Highest education level: Not on file  Occupational History   Not on file  Tobacco Use   Smoking status: Never   Smokeless tobacco: Never  Substance and Sexual Activity  Alcohol use: Never   Drug use: Never   Sexual activity: Not on file  Other Topics Concern   Not on file  Social History Narrative   Not on file   Social Determinants of Health   Financial Resource Strain: Not on file  Food Insecurity: Not on file  Transportation Needs: Not on  file  Physical Activity: Not on file  Stress: Not on file  Social Connections: Not on file    Allergies: No Known Allergies  Metabolic Disorder Labs: Lab Results  Component Value Date   HGBA1C 5.8 (H) 07/25/2020   MPG 119.76 07/25/2020   No results found for: "PROLACTIN" Lab Results  Component Value Date   CHOL 138 07/25/2020   TRIG 25 07/25/2020   HDL 51 07/25/2020   CHOLHDL 2.7 07/25/2020   VLDL 5 07/25/2020   LDLCALC 82 07/25/2020   Lab Results  Component Value Date   TSH 0.699 07/25/2020    Therapeutic Level Labs: No results found for: "LITHIUM" No results found for: "VALPROATE" Lab Results  Component Value Date   CBMZ 4.9 07/29/2020    Current Medications: Current Outpatient Medications  Medication Sig Dispense Refill   carbamazepine (TEGRETOL) 100 MG chewable tablet Chew 1 tablet (100 mg total) by mouth 2 (two) times daily. 60 tablet 2   No current facility-administered medications for this visit.     Musculoskeletal: Strength & Muscle Tone: Unable to assess due to telemedicine visit Gait & Station: Unable to assess due to telemedicine visit Patient leans: Unable to assess due to telemedicine visit  Psychiatric Specialty Exam: Review of Systems  Psychiatric/Behavioral:  Negative for decreased concentration, dysphoric mood, hallucinations, self-injury, sleep disturbance and suicidal ideas. The patient is not nervous/anxious and is not hyperactive.     There were no vitals taken for this visit.There is no height or weight on file to calculate BMI.  General Appearance: Unable to assess due to telemedicine visit  Eye Contact:  Unable to assess due to telemedicine visit  Speech:  Clear and Coherent and Normal Rate  Volume:  Normal  Mood:  Euthymic  Affect:  Appropriate  Thought Process:  Coherent and Descriptions of Associations: Intact  Orientation:  Full (Time, Place, and Person)  Thought Content: WDL   Suicidal Thoughts:  No  Homicidal Thoughts:   No  Memory:  Immediate;   Good Recent;   Good Remote;   Good  Judgement:  Good  Insight:  Good  Psychomotor Activity:  Normal  Concentration:  Concentration: Good and Attention Span: Good  Recall:  Good  Fund of Knowledge: Good  Language: Good  Akathisia:  NA  Handed:  Right  AIMS (if indicated): not done  Assets:  Communication Skills Desire for Improvement Housing Social Support Vocational/Educational  ADL's:  Intact  Cognition: WNL  Sleep:  Good   Screenings: AIMS    Flowsheet Row [facility]  AIMS Total Score 0      AUDIT    Flowsheet Row Ad[facility]  Alcohol Use Disorder Identification Test Final Score (AUDIT) 0      GAD-7    Flowsheet Row [facility] 07/10/2022 in [facility] Video Visit from 04/10/2022 in [facility] Video Visit from 12/12/2021 in [facility] Video Visit from 09/12/2021 in [facility] Video Visit from 06/13/2021 in [facility]  Total GAD-7 Score 1 6 0 3 12      PHQ2-9    Flowsheet Row Video Visit from 07/10/2022 in [facility] Video Visit from 04/10/2022 in [facility] Video Visit from 12/12/2021 in [facility] Video Visit from 09/12/2021 in [facility] Video Visit from 06/13/2021 in [facility]  PHQ-2 Total Score 0 0 0 1 0      Flowsheet Row Video Visit from 07/10/2022 in [facility] Video Visit from 04/10/2022 in [facility] Video Visit from 12/12/2021 in [place] No Risk No Risk No Risk         Assessment and Plan:   Jatinder Buffkin is a 21 year old male with a past psychiatric history significant for intermittent explosive disorder and adult who presents to [facility]patient clinic via virtual telephone visit for follow-up and medication management.  Patient denies any issues or concerns regarding his current medication regimen.  Patient denies depressive episodes or anxiety and appears to be stable on his current use of carbamazepine.  Patient is requesting refills following the conclusion of the encounter.  Patient's medication to be be e-prescribed to pharmacy of choice.  Collaboration of Care: Collaboration of Care: Medication Management AEB patient's medications being managed by this provider and Psychiatrist AEB patient being managed by this behavioral health provider  1. Intermittent explosive disorder in adult  - carbamazepine (TEGRETOL) 100 MG chewable tablet; Chew 1 tablet (100 mg total) by mouth 2 (two) times daily.  Dispense: 60 tablet; Refill: 2  Patient to follow up in 3 months Provider spent a total of 10 minutes with the patient/reviewing patient's chart  Malachy Mood, PA 07/10/2022, 1:15 PM

## 2022-10-09 ENCOUNTER — Telehealth (HOSPITAL_COMMUNITY): Payer: Medicaid Other | Admitting: Physician Assistant

## 2022-10-14 ENCOUNTER — Telehealth (HOSPITAL_COMMUNITY): Payer: Medicaid Other | Admitting: Psychiatry

## 2022-10-14 ENCOUNTER — Encounter (HOSPITAL_COMMUNITY): Payer: Self-pay

## 2022-10-14 NOTE — Progress Notes (Addendum)
Logged into caregility app and waited for 15 minutes for video appointment but patient did not show up.  Tried to call patient but was unsuccessful.  Karsten Ro, MD PGY3 Psychiatry Resident  [facility]

## 2023-01-05 ENCOUNTER — Encounter (HOSPITAL_COMMUNITY): Payer: Self-pay | Admitting: Psychiatry
